# Patient Record
Sex: Male | Born: 1945 | Race: White | Hispanic: No | Marital: Married | Smoking: Former smoker
Health system: Southern US, Community
[De-identification: ages and names within clinical notes are randomized; demographics above are authoritative.]

## PROBLEM LIST (undated history)

## (undated) DIAGNOSIS — E119 Type 2 diabetes mellitus without complications: Secondary | ICD-10-CM

## (undated) DIAGNOSIS — E7800 Pure hypercholesterolemia, unspecified: Secondary | ICD-10-CM

## (undated) DIAGNOSIS — I10 Essential (primary) hypertension: Secondary | ICD-10-CM

---

## 2013-09-13 DIAGNOSIS — J329 Chronic sinusitis, unspecified: Secondary | ICD-10-CM | POA: Insufficient documentation

## 2013-09-13 DIAGNOSIS — Z79899 Other long term (current) drug therapy: Secondary | ICD-10-CM | POA: Insufficient documentation

## 2013-09-13 DIAGNOSIS — R209 Unspecified disturbances of skin sensation: Secondary | ICD-10-CM | POA: Insufficient documentation

## 2013-09-13 DIAGNOSIS — Z87891 Personal history of nicotine dependence: Secondary | ICD-10-CM | POA: Insufficient documentation

## 2013-09-13 DIAGNOSIS — E7800 Pure hypercholesterolemia, unspecified: Secondary | ICD-10-CM | POA: Insufficient documentation

## 2013-09-13 DIAGNOSIS — I10 Essential (primary) hypertension: Secondary | ICD-10-CM | POA: Insufficient documentation

## 2013-09-13 DIAGNOSIS — Z7982 Long term (current) use of aspirin: Secondary | ICD-10-CM | POA: Insufficient documentation

## 2013-09-13 DIAGNOSIS — M6281 Muscle weakness (generalized): Secondary | ICD-10-CM | POA: Insufficient documentation

## 2013-09-13 DIAGNOSIS — E119 Type 2 diabetes mellitus without complications: Secondary | ICD-10-CM | POA: Insufficient documentation

## 2013-09-13 DIAGNOSIS — R269 Unspecified abnormalities of gait and mobility: Secondary | ICD-10-CM | POA: Insufficient documentation

## 2013-09-14 ENCOUNTER — Emergency Department (HOSPITAL_BASED_OUTPATIENT_CLINIC_OR_DEPARTMENT_OTHER)
Admission: EM | Admit: 2013-09-14 | Discharge: 2013-09-14 | Disposition: A | Discharge status: Home / Self Care | Payer: 59 | Attending: Emergency Medicine | Admitting: Emergency Medicine

## 2013-09-14 ENCOUNTER — Encounter (HOSPITAL_BASED_OUTPATIENT_CLINIC_OR_DEPARTMENT_OTHER): Payer: Self-pay | Admitting: Emergency Medicine

## 2013-09-14 ENCOUNTER — Emergency Department (HOSPITAL_BASED_OUTPATIENT_CLINIC_OR_DEPARTMENT_OTHER): Payer: 59

## 2013-09-14 DIAGNOSIS — R739 Hyperglycemia, unspecified: Secondary | ICD-10-CM

## 2013-09-14 DIAGNOSIS — R200 Anesthesia of skin: Secondary | ICD-10-CM

## 2013-09-14 DIAGNOSIS — J329 Chronic sinusitis, unspecified: Secondary | ICD-10-CM

## 2013-09-14 HISTORY — DX: Pure hypercholesterolemia, unspecified: E78.00

## 2013-09-14 HISTORY — DX: Essential (primary) hypertension: I10

## 2013-09-14 HISTORY — DX: Type 2 diabetes mellitus without complications: E11.9

## 2013-09-14 LAB — COMPREHENSIVE METABOLIC PANEL
ALBUMIN: 4.6 g/dL (ref 3.5–5.2)
ALT: 28 U/L (ref 0–53)
AST: 20 U/L (ref 0–37)
Alkaline Phosphatase: 162 U/L — ABNORMAL HIGH (ref 39–117)
BUN: 22 mg/dL (ref 6–23)
CALCIUM: 10.7 mg/dL — AB (ref 8.4–10.5)
CO2: 25 mEq/L (ref 19–32)
Chloride: 97 mEq/L (ref 96–112)
Creatinine, Ser: 0.8 mg/dL (ref 0.50–1.35)
GFR calc non Af Amer: 90 mL/min — ABNORMAL LOW (ref >90)
GLUCOSE: 387 mg/dL — AB (ref 70–99)
Potassium: 4.2 mEq/L (ref 3.7–5.3)
SODIUM: 139 meq/L (ref 137–147)
TOTAL PROTEIN: 8.6 g/dL — AB (ref 6.0–8.3)
Total Bilirubin: 0.4 mg/dL (ref 0.3–1.2)

## 2013-09-14 LAB — CBG MONITORING, ED
GLUCOSE-CAPILLARY: 399 mg/dL — AB (ref 70–99)
Glucose-Capillary: 327 mg/dL — ABNORMAL HIGH (ref 70–99)
Glucose-Capillary: 207 mg/dL — ABNORMAL HIGH (ref 70–99)

## 2013-09-14 LAB — PROTIME-INR
INR: 0.98 (ref 0.00–1.49)
PROTHROMBIN TIME: 12.8 s (ref 11.6–15.2)

## 2013-09-14 LAB — CBC
HEMATOCRIT: 43.8 % (ref 39.0–52.0)
HEMOGLOBIN: 15.2 g/dL (ref 13.0–17.0)
MCH: 30 pg (ref 26.0–34.0)
MCHC: 34.7 g/dL (ref 30.0–36.0)
MCV: 86.6 fL (ref 78.0–100.0)
Platelets: 187 10*3/uL (ref 150–400)
RBC: 5.06 MIL/uL (ref 4.22–5.81)
RDW: 13.5 % (ref 11.5–15.5)
WBC: 5.8 10*3/uL (ref 4.0–10.5)

## 2013-09-14 LAB — APTT: aPTT: 29 seconds (ref 24–37)

## 2013-09-14 LAB — TROPONIN I: Troponin I: <0.3 ng/mL (ref <0.30)

## 2013-09-14 MED ORDER — AZITHROMYCIN 250 MG PO TABS: 250.0000 mg | ORAL_TABLET | Freq: Every day | ORAL | Status: AC

## 2013-09-14 MED ORDER — SODIUM CHLORIDE 0.9 % IV BOLUS (SEPSIS)
1000.0000 mL | Freq: Once | INTRAVENOUS | Status: AC
  Administered 2013-09-14: 1000 mL via INTRAVENOUS

## 2013-09-14 MED ORDER — INSULIN REGULAR HUMAN 100 UNIT/ML IJ SOLN
5.0000 [IU] | Freq: Once | INTRAMUSCULAR | Status: AC
  Administered 2013-09-14: 5 [IU] via INTRAVENOUS
  Filled 2013-09-14: qty 1

## 2013-09-14 NOTE — ED Notes (Addendum)
Blood sugar over 400 at home tonight.  Sts he has been taking Sudafed and OTC cough medicine x3 days.  Glucose normally runs 160 - 200 per pt.

## 2013-09-14 NOTE — ED Provider Notes (Signed)
CSN: [id number]     Arrival date & time 09/13/13  2353 History   First MD Initiated Contact with Patient 09/14/13 0121     Chief Complaint  Patient presents with  . Hyperglycemia     (Consider location/radiation/quality/duration/timing/severity/associated sxs/prior Treatment) HPI Comments: 68 year old male with a history of hypertension, hypercholesterolemia and type 2 diabetes who presents with a complaint of high blood sugar. The patient has had diabetes for approximately 5 years, he is currently under the care of a primary care physician in the state of [place]. He is currently traveling here to see family members in a swim tournament. He states that over the last couple of days he has had increased blood sugar which usually runs between 150 and 200 but has been over 400 this evening. He has had some increased urinary frequency but denies any other symptoms including lightheadedness, dysuria, chest pain, back pain, abdominal pain, headache, blurred vision, slurred speech, weakness. He has noticed some upper respiratory symptoms including nasal drainage and a cough which is productive of phlegm and has also noticed left lower extremity weakness and numbness which began approximately 7:00 this evening and has gradually improved. 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 This evening however he developed numbness distal to the left knee on the medial surface of the leg and the foot. This was associated with the inability to walk at baseline stating that he felt like he was having difficulty lifting his leg and somewhat dragging it when he walked. There were no symptoms in the arms or the right side of his body including his right lower extremity. He has no history of stroke and has never had a workup for this. He does take a daily baby aspirin.  The patient  endorses taking his metformin as prescribed by his doctor which is 1000 mg by mouth twice daily  Patient is a 68 y.o. male presenting with hyperglycemia. The history is provided by the patient and a relative.  Hyperglycemia   Past Medical History  Diagnosis Date  . Diabetes mellitus without complication   . Hypertension   . High cholesterol    History reviewed. No pertinent past surgical history. No family history on file. History  Substance Use Topics  . Smoking status: Former Games developer  . Smokeless tobacco: Not on file  . Alcohol Use: No    Review of Systems  All other systems reviewed and are negative.      Allergies  Review of patient's allergies indicates not on file.  Home Medications   Current Outpatient Rx  Name  Route  Sig  Dispense  Refill  . amLODipine (NORVASC) 10 MG tablet   Oral   Take 10 mg by mouth daily.         Marland Kitchen aspirin 81 MG tablet   Oral   Take 81 mg by mouth daily.         Marland Kitchen azithromycin (ZITHROMAX Z-PAK) 250 MG tablet   Oral   Take 1 tablet (250 mg total) by mouth daily. 500mg  PO day 1, then 250mg  PO days 205   6 tablet   0   . doxazosin (CARDURA) 4 MG tablet   Oral   Take 4 mg by mouth daily.         Marland Kitchen lisinopril (PRINIVIL,ZESTRIL) 40 MG tablet   Oral   Take  40 mg by mouth daily.         . metFORMIN (GLUCOPHAGE) 1000 MG tablet   Oral   Take 1,000 mg by mouth 2 (two) times daily with a meal.         . Multiple Vitamins-Minerals (MULTIVITAMIN & MINERAL PO)   Oral   Take by mouth.         . simvastatin (ZOCOR) 20 MG tablet   Oral   Take 20 mg by mouth daily.         . tamsulosin (FLOMAX) 0.4 MG CAPS capsule   Oral   Take 0.4 mg by mouth.          BP 133/87  Pulse 95  Temp(Src) 98.6 F (37 C) (Oral)  Resp 18  Ht 6' (1.829 m)  Wt 200 lb (90.719 kg)  BMI 27.12 kg/m2  SpO2 96% Physical Exam  Nursing note and vitals reviewed. Constitutional: He appears well-developed and well-nourished. No distress.   HENT:  Head: Normocephalic and atraumatic.  Mouth/Throat: Oropharynx is clear and moist. No oropharyngeal exudate.  Eyes: Conjunctivae and EOM are normal. Pupils are equal, round, and reactive to light. Right eye exhibits no discharge. Left eye exhibits no discharge. No scleral icterus.  Neck: Normal range of motion. Neck supple. No JVD present. No thyromegaly present.  Cardiovascular: Normal rate, regular rhythm, normal heart sounds and intact distal pulses.  Exam reveals no gallop and no friction rub.   No murmur heard. Occasional ectopy, no JVD, no carotid bruit  Pulmonary/Chest: Effort normal and breath sounds normal. No respiratory distress. He has no wheezes. He has no rales.  Abdominal: Soft. Bowel sounds are normal. He exhibits no distension and no mass. There is no tenderness.  Musculoskeletal: Normal range of motion. He exhibits no edema and no tenderness.  Lymphadenopathy:    He has no cervical adenopathy.  Neurological: He is alert. Coordination normal.  Speech is clear, cranial nerves III through XII are intact, memory is intact, strength is normal in all 4 extremities including grips, sensation is intact to light touch and pinprick in all 4 extremities. Coordination as tested by finger-nose-finger is normal, no limb ataxia. Gait slightly abnormal with some difficulty with the left lower extremity, walks with a limp however no foot drop, normal strength and major muscle groups when isolated including the hip knee and ankle., normal reflexes at the patellar tendons bilaterally  Skin: Skin is warm and dry. No rash noted. No erythema.  Psychiatric: He has a normal mood and affect. His behavior is normal.    ED Course  Procedures (including critical care time) Labs Review Labs Reviewed  COMPREHENSIVE METABOLIC PANEL - Abnormal; Notable for the following:    Glucose, Bld 387 (*)    Calcium 10.7 (*)    Total Protein 8.6 (*)    Alkaline Phosphatase 162 (*)    GFR calc non Af Amer  90 (*)    All other components within normal limits  CBG MONITORING, ED - Abnormal; Notable for the following:    Glucose-Capillary 399 (*)    All other components within normal limits  CBG MONITORING, ED - Abnormal; Notable for the following:    Glucose-Capillary 327 (*)    All other components within normal limits  CBG MONITORING, ED - Abnormal; Notable for the following:    Glucose-Capillary 207 (*)    All other components within normal limits  CBC  APTT  PROTIME-INR  TROPONIN I   Imaging Review Dg Chest  2 View  09/14/2013   CLINICAL DATA:  Shortness of breath  EXAM: CHEST  2 VIEW  COMPARISON:  None.  FINDINGS: No cardiomegaly. Aortic tortuosity. Suspect multiple pulmonary calcified granulomas, best seen in the right middle lobe anteriorly. No edema or consolidation. No effusion or pneumothorax.  IMPRESSION: No active cardiopulmonary disease.   Electronically Signed   By: Tiburcio Pea M.D.   On: 09/14/2013 02:07   Ct Head Wo Contrast  09/14/2013   CLINICAL DATA:  Hyperglycemia, left leg feels like it is asleep  EXAM: CT HEAD WITHOUT CONTRAST  TECHNIQUE: Contiguous axial images were obtained from the base of the skull through the vertex without intravenous contrast.  COMPARISON:  None.  FINDINGS: Negative for acute intracranial hemorrhage, acute infarction, mass, mass effect, hydrocephalus or midline shift. Gray-white differentiation is preserved throughout. No acute soft tissue or calvarial abnormality. The globes and orbits are symmetric and unremarkable. Normal aeration of the mastoid air cells. Mild mucoperiosteal thickening throughout the ethmoid air cells. . Atherosclerotic calcifications in the bilateral cavernous carotid arteries.  IMPRESSION: Negative head CT.  Intracranial atherosclerosis.  Mild inflammatory paranasal sinus disease.   Electronically Signed   By: Malachy Moan M.D.   On: 09/14/2013 02:24     EKG Interpretation   Date/Time:  Tuesday September 14 2013 01:36:40  EDT Ventricular Rate:  74 PR Interval:  172 QRS Duration: 126 QT Interval:  396 QTC Calculation: 439 R Axis:   24 Text Interpretation:  Normal sinus rhythm Non-specific intra-ventricular  conduction block Abnormal ECG No old tracing to compare Confirmed by  Tenae Graziosi  MD, Salvadore Valvano ([id number]) on 09/14/2013 2:04:55 AM      MDM   Final diagnoses:  Hyperglycemia  Lower extremity numbness  Sinusitis    The patient has some symptoms of upper respiratory tract infection with cough and nasal drainage which may be the source of the hyperglycemia. He has also been traveling and not following a diabetic diet. Confounding this is the fact that the patient has some left lower extremity weakness and some gait abnormality which he has noticed today which is new for him. I have recommended that he undergo a stroke workup here in the emergency department which he has agreed to. IV fluids, labs  Laboratory results show that the patient has no significant acidosis, normal blood counts and improving blood glucose after being given IV fluids and a small amount of insulin. The patient has a CT scan of the head showing some sinus disease but no acute stroke findings. These findings have all been reviewed with the patient and family members. I have encouraged them to have further evaluation and have offered them admission to the hospital to have this done immediately. The patient has declined but will pursue further evaluation upon his return home. I have recommended a full dose aspirin rather than a baby aspirin daily and will treat his sinus infection with Zithromax. He is in agreement, and repeat evaluation of his lower extremity again shows no weakness or numbness upon supine testing, gait remains unchanged, patient feels his symptoms are gradually improving.  Meds given in ED:  Medications  sodium chloride 0.9 % bolus 1,000 mL (0 mLs Intravenous Stopped 09/14/13 0422)  insulin regular (NOVOLIN R,HUMULIN R) 100  units/mL injection 5 Units (5 Units Intravenous Given 09/14/13 0302)    New Prescriptions   AZITHROMYCIN (ZITHROMAX Z-PAK) 250 MG TABLET    Take 1 tablet (250 mg total) by mouth daily. 500mg  PO day 1, then 250mg   PO days 205      Vida RollerBrian D Cambryn Charters, MD 09/14/13 0430

## 2013-09-14 NOTE — Discharge Instructions (Signed)
Please start take 325 (full aspiring)mg of aspirin daily - you MUST follow up with your doctor immediately to finish the work up for a posisble small stroke when you get home.  This would include an MRI of your brain (your CT scan looked normal here) and ultrasound evaluation of your heart and carotid arteries.    I have included in your paperwork the results of your evaluation here as well as the CD containing the CT scan images.  Your CT scan does show some sinus disease - take the zithormax for this reason.  You MUST follow a strict diabetic diet. - talk to your doctor about potentially adding more medicine for better diabetic control upon your return to [place].  Please call your doctor for a followup appointment within 24-48 hours. When you talk to your doctor please let them know that you were seen in the emergency department and have them acquire all of your records so that they can discuss the findings with you and formulate a treatment plan to fully care for your new and ongoing problems.

## 2013-09-14 NOTE — ED Notes (Signed)
POC CBG 207

## 2014-11-30 IMAGING — CR DG CHEST 2V
2 series · 2 of 2 positions shown · non-contrast
Comparison: None.

CLINICAL DATA: Shortness of breath

EXAM:
CHEST  2 VIEW

[w chest pa]
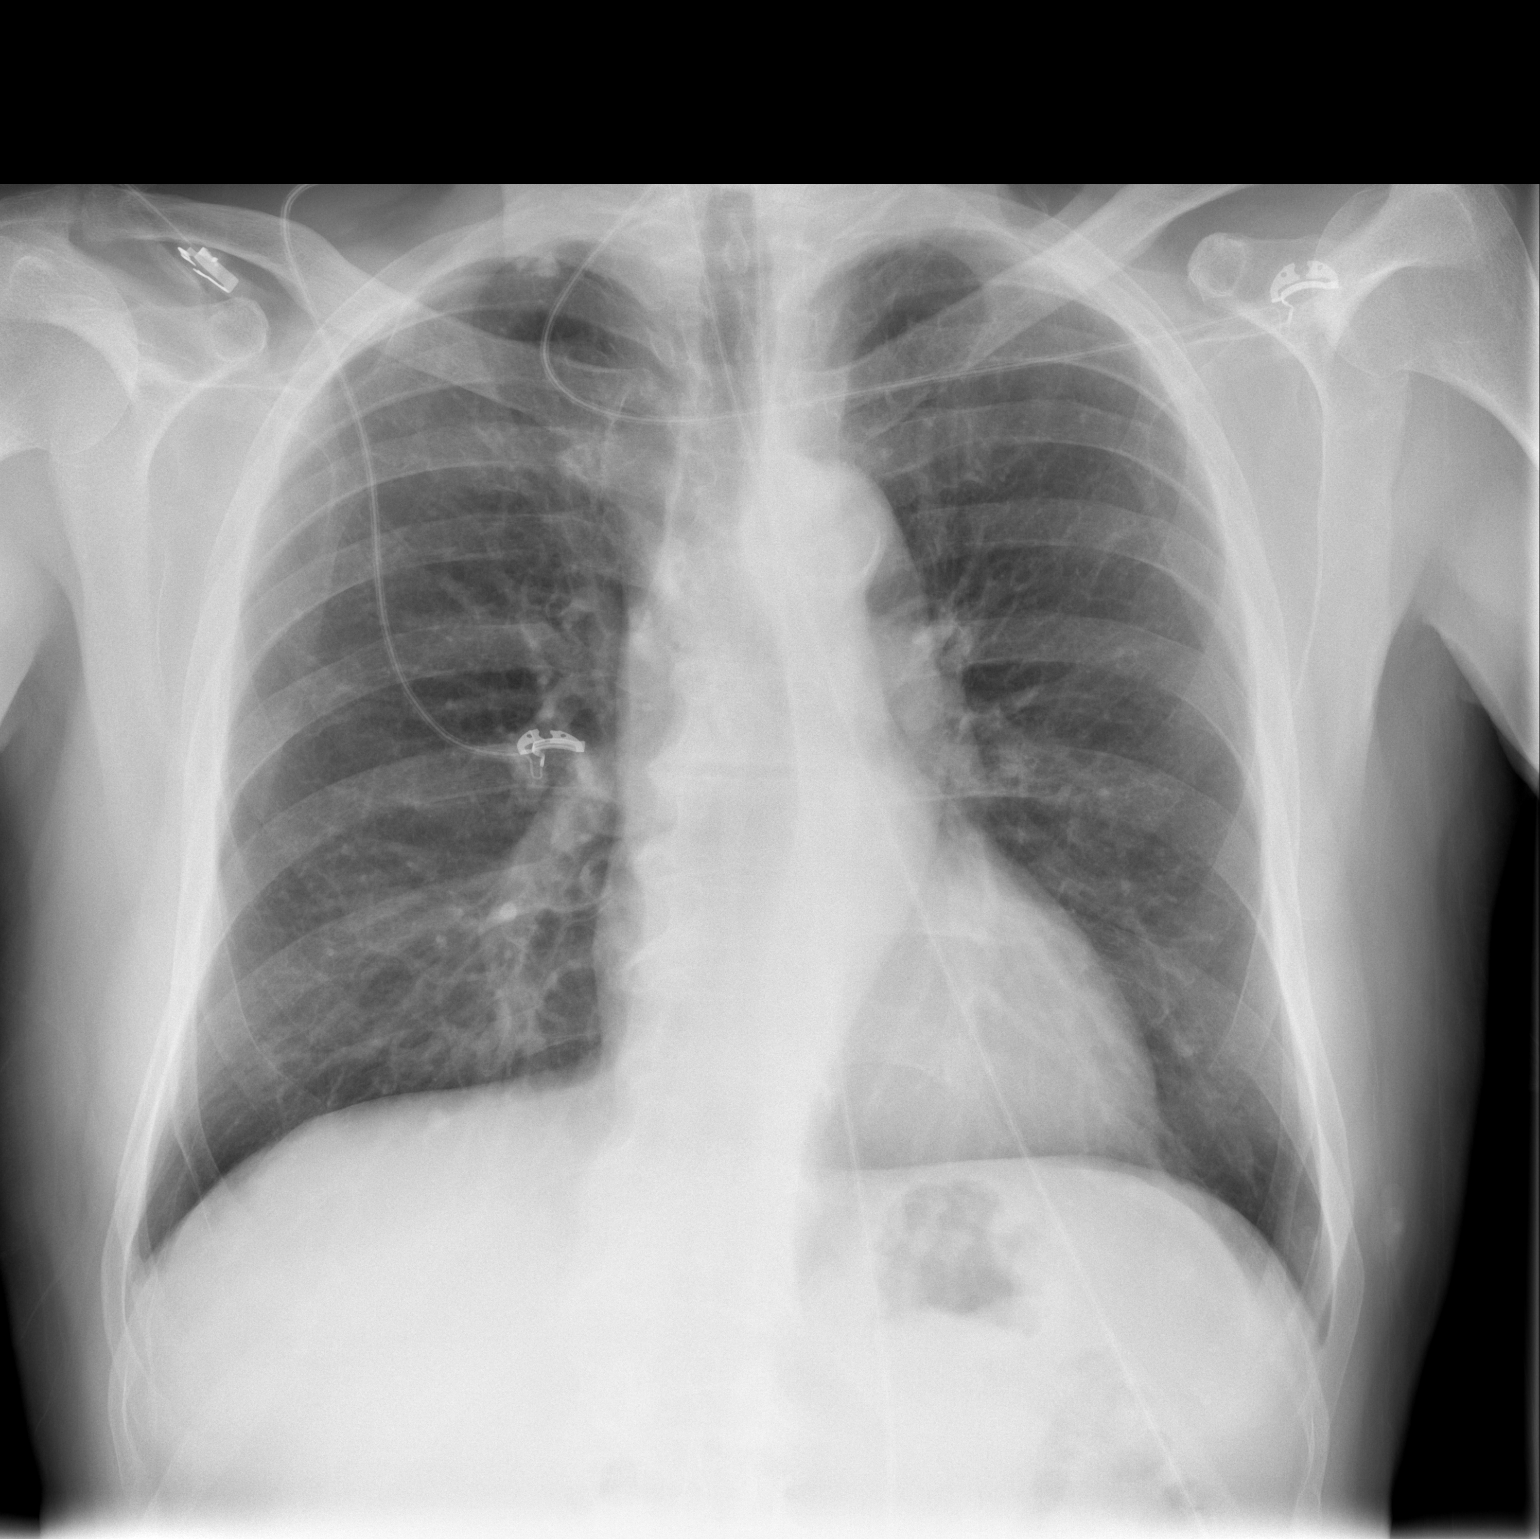

[w chest lat]
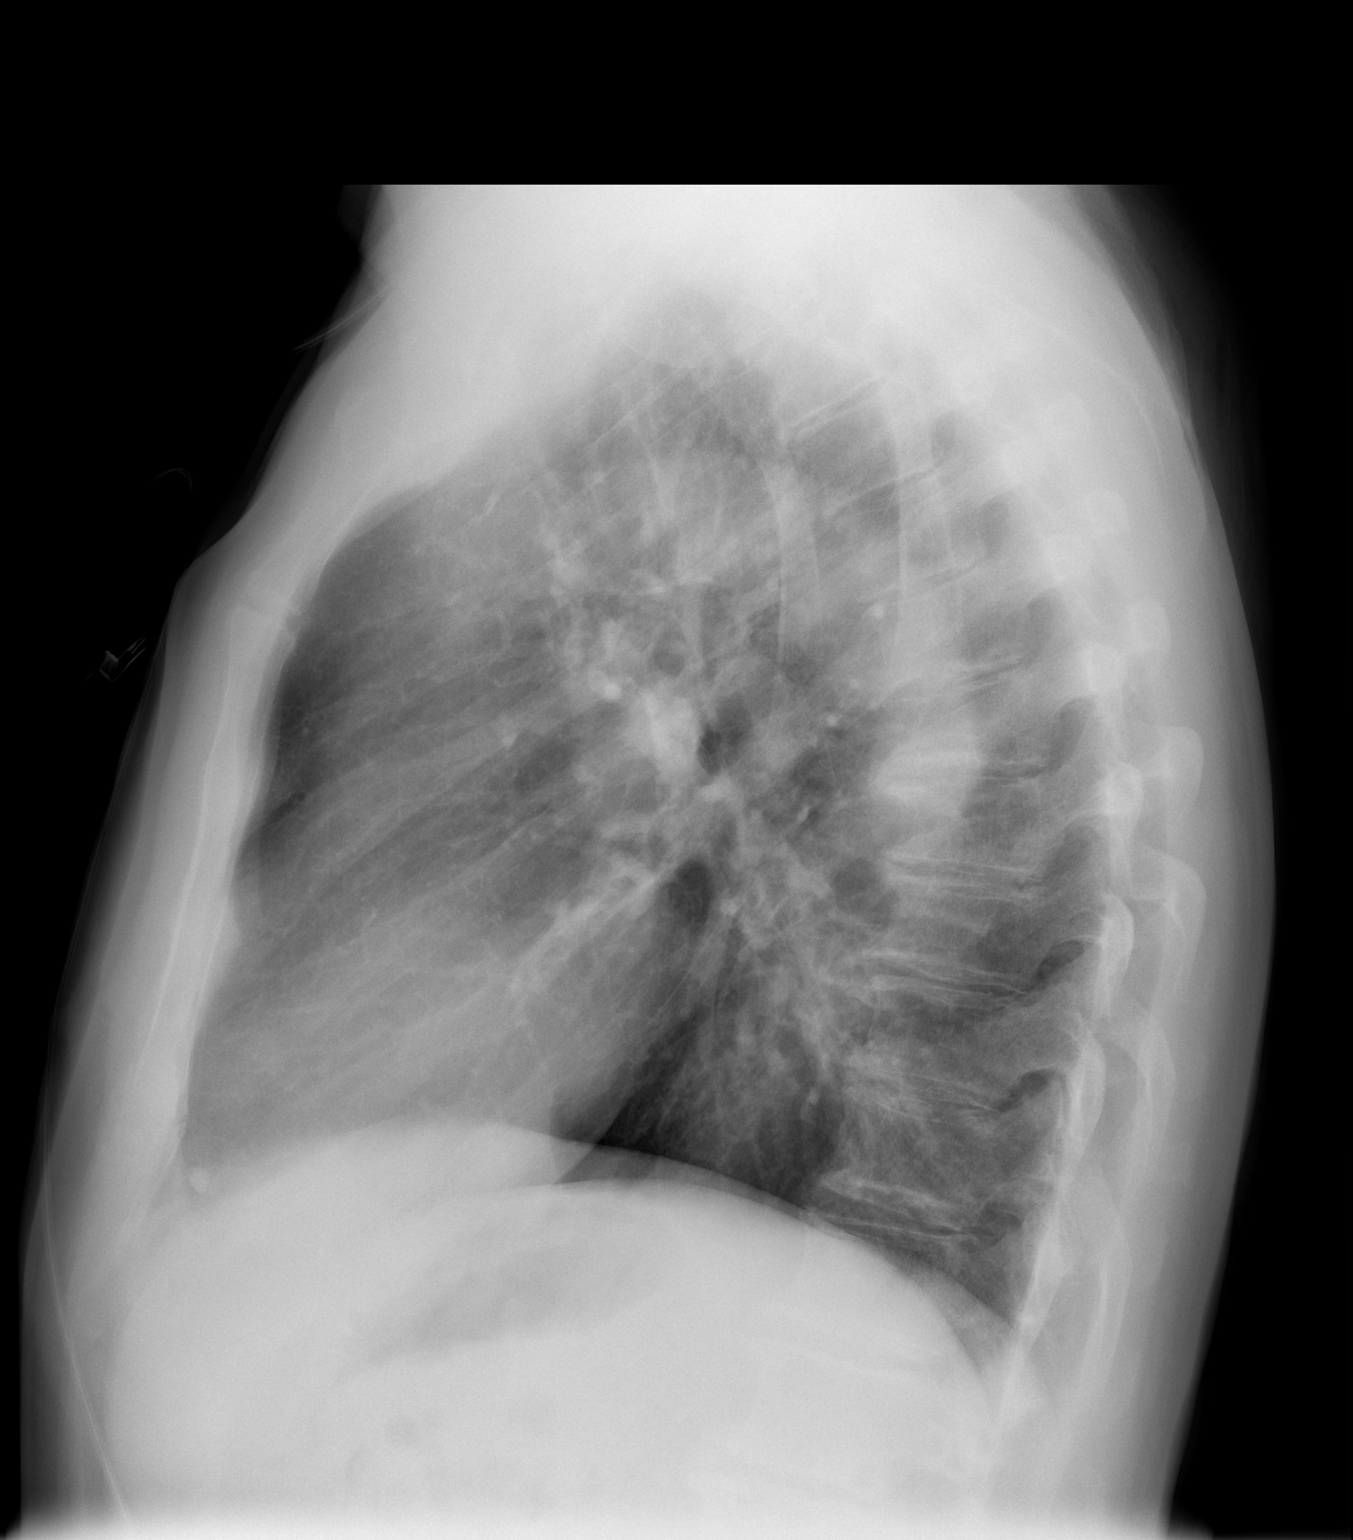

[2 of 2 positions shown; findings below may reference images not displayed]

FINDINGS: No cardiomegaly. Aortic tortuosity. Suspect multiple pulmonary
calcified granulomas, best seen in the right middle lobe anteriorly.
No edema or consolidation. No effusion or pneumothorax.
IMPRESSION: No active cardiopulmonary disease.

## 2014-11-30 IMAGING — CT CT HEAD W/O CM
1 series · 16 of 30 positions shown, 20 images · non-contrast
Comparison: None.

CLINICAL DATA: Hyperglycemia, left leg feels like it is asleep

EXAM:
CT HEAD WITHOUT CONTRAST
TECHNIQUE: Contiguous axial images were obtained from the base of the skull
through the vertex without intravenous contrast.

[Series 2: head 4.8 h37s · axial · 0.46mm/px · z∈[-156,+0]mm · 16 of 36 slices shown, 20 images]
[im 2/36  brain]
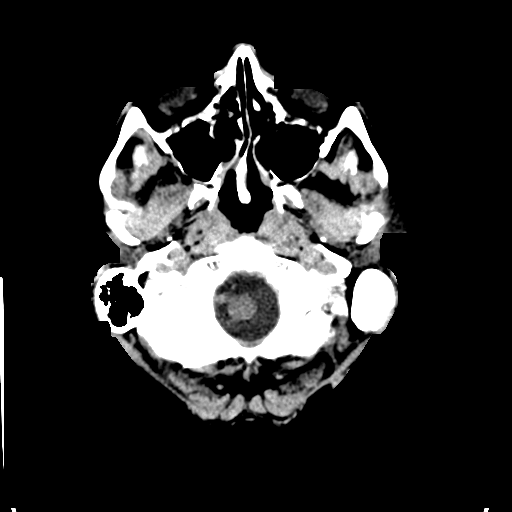
[im 2/36  bone]
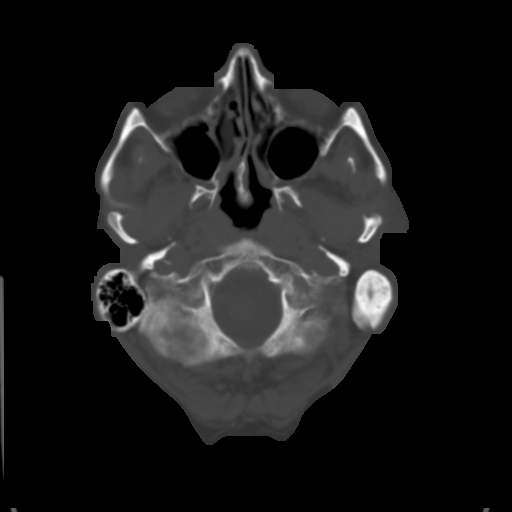
[im 4/36  brain]
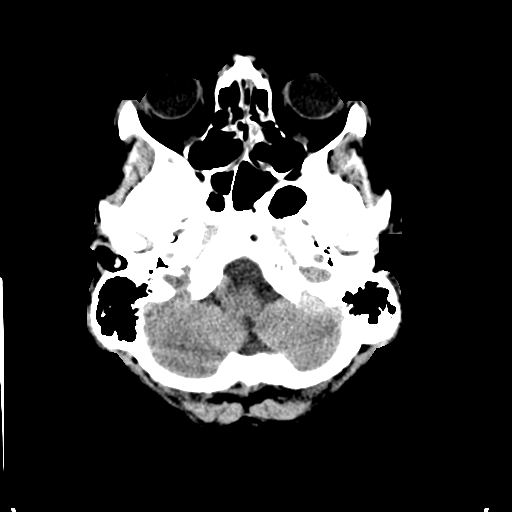
[im 7/36  brain]
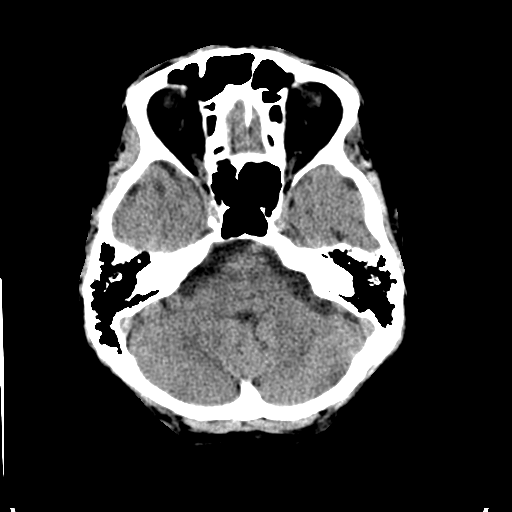
[im 9/36  brain]
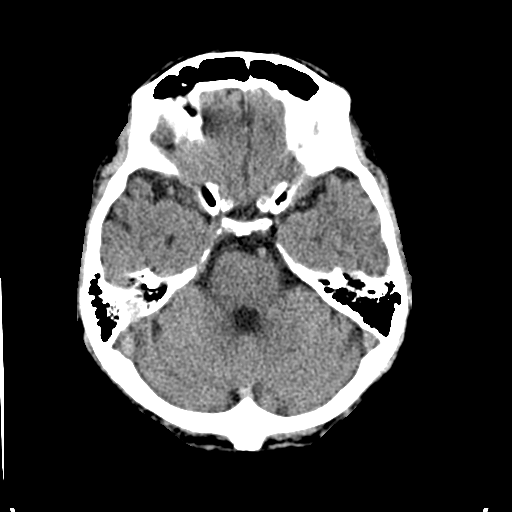
[im 10/36  brain]
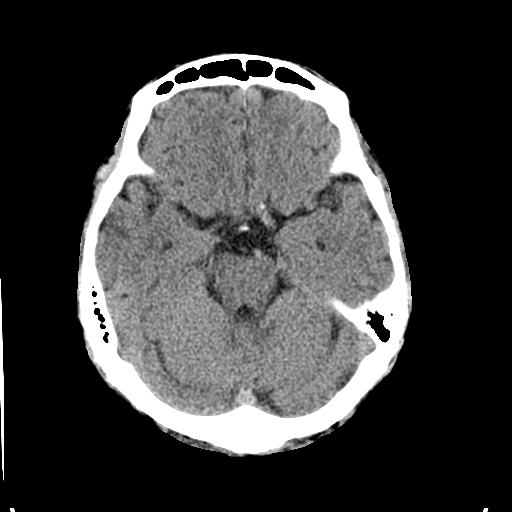
[im 10/36  bone]
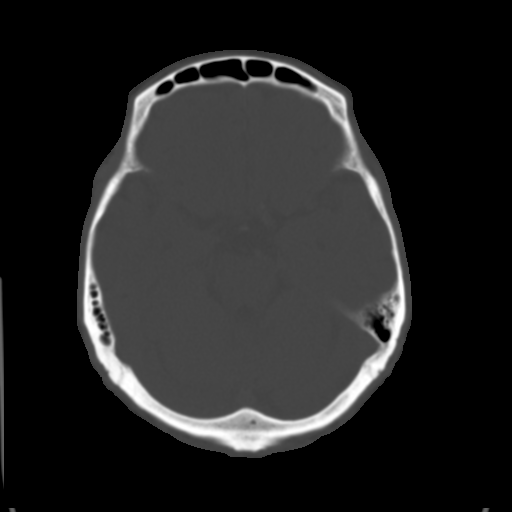
[im 13/36  brain]
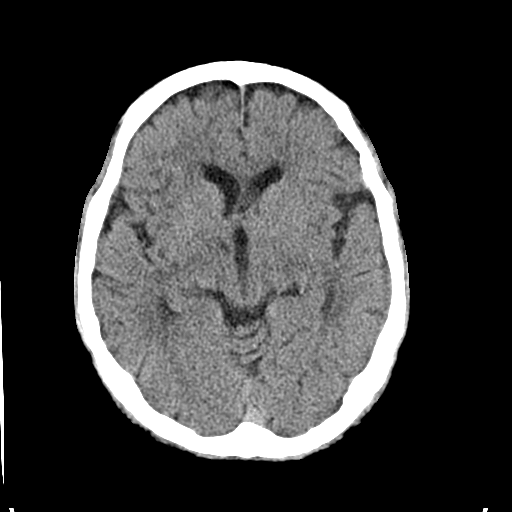
[im 15/36  brain]
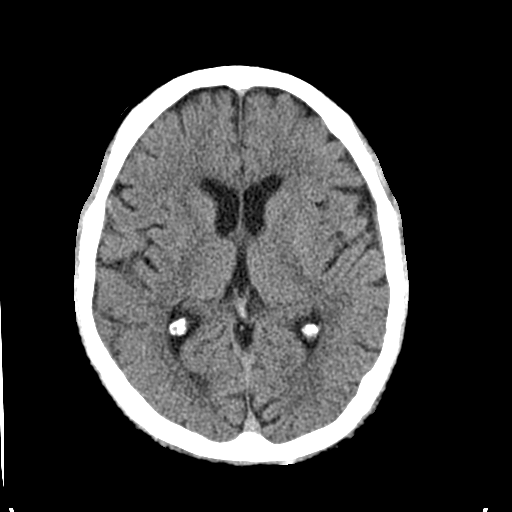
[im 17/36  brain]
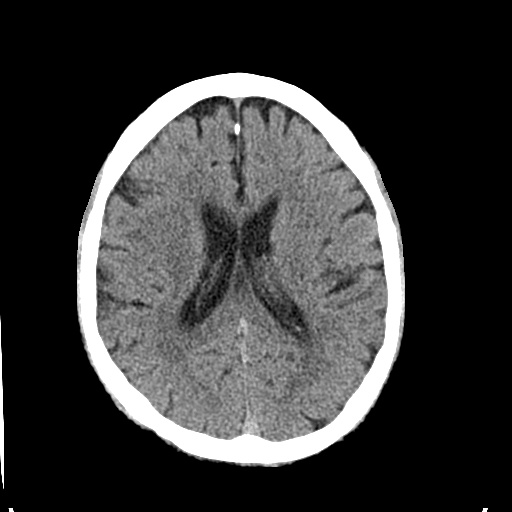
[im 19/36  brain]
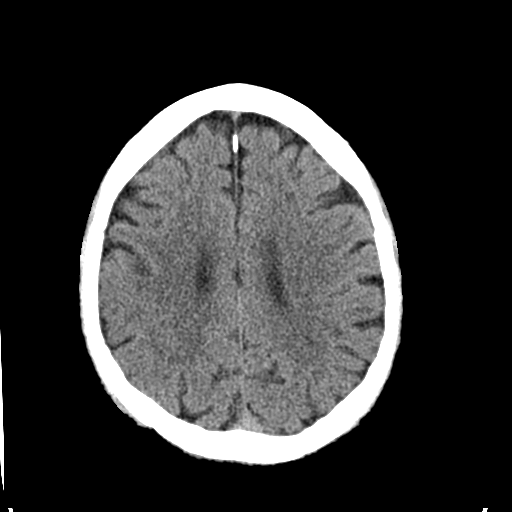
[im 19/36  bone]
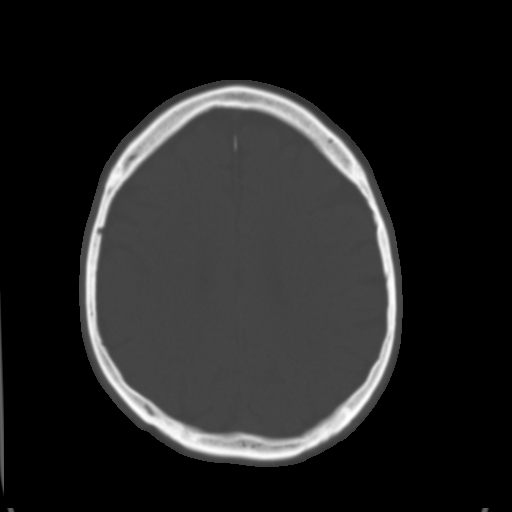
[im 21/36  brain]
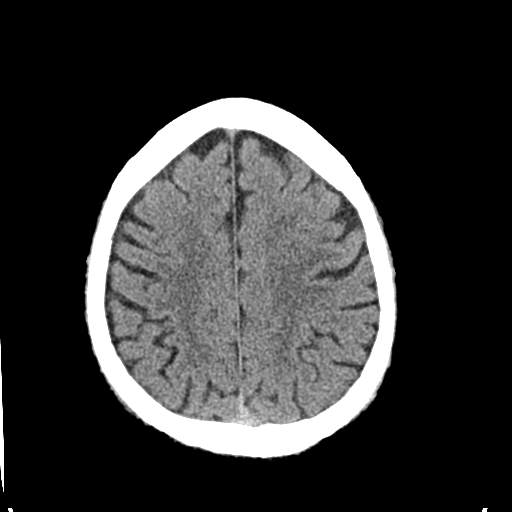
[im 23/36  brain]
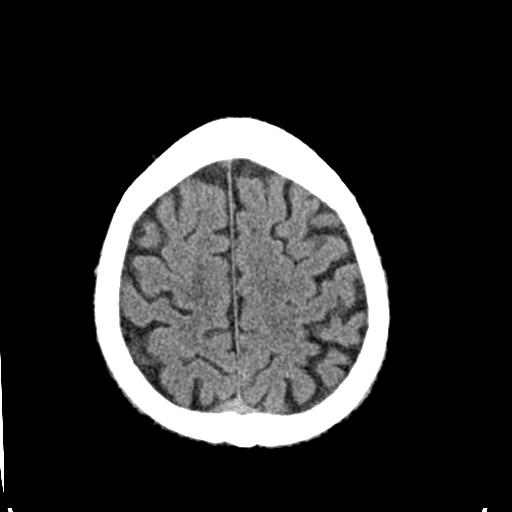
[im 26/36  brain]
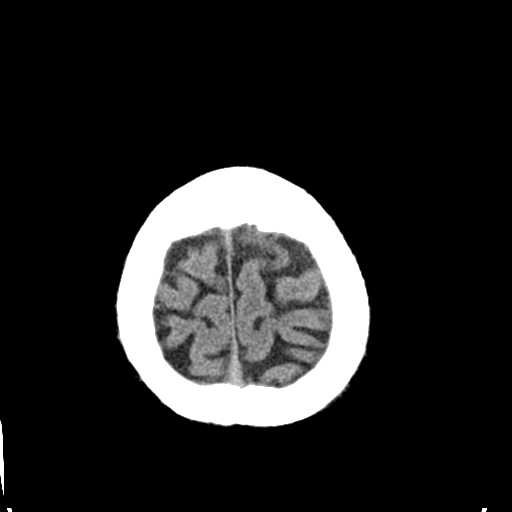
[im 27/36  brain]
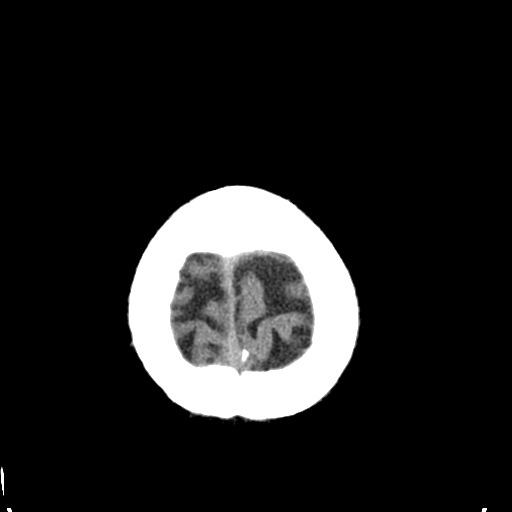
[im 27/36  bone]
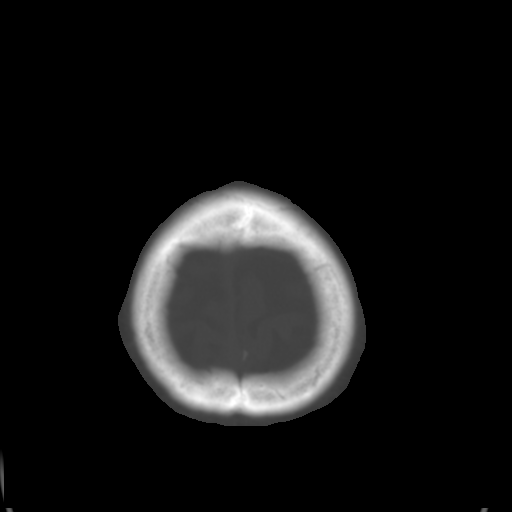
[im 29/36  brain]
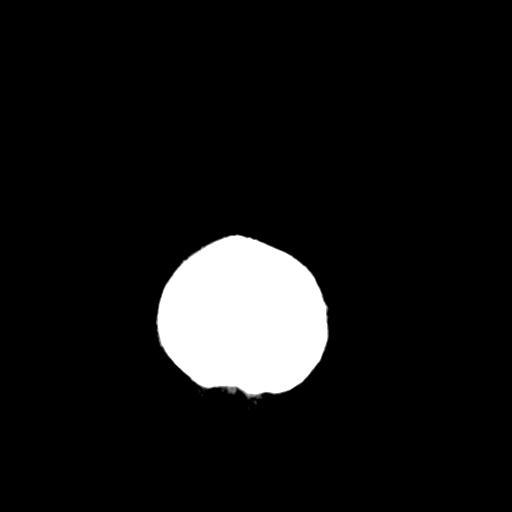
[im 32/36  brain]
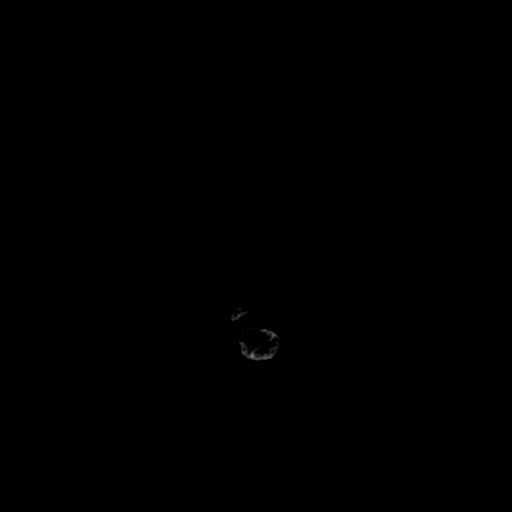
[im 34/36  brain]
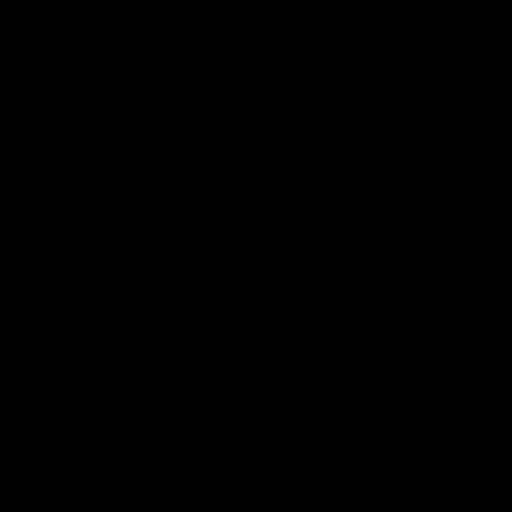

[16 of 30 positions shown; findings below may reference images not displayed]

FINDINGS: Negative for acute intracranial hemorrhage, acute infarction, mass,
mass effect, hydrocephalus or midline shift. Gray-white
differentiation is preserved throughout. No acute soft tissue or
calvarial abnormality. The globes and orbits are symmetric and
unremarkable. Normal aeration of the mastoid air cells. Mild
mucoperiosteal thickening throughout the ethmoid air cells. .
Atherosclerotic calcifications in the bilateral cavernous carotid
arteries.
IMPRESSION: Negative head CT.

Intracranial atherosclerosis.

Mild inflammatory paranasal sinus disease.

## 2021-06-17 DEATH — deceased
